# Patient Record
Sex: Male | Born: 1960 | Race: Black or African American | Hispanic: No | Marital: Married | Smoking: Never smoker
Health system: Southern US, Community
[De-identification: ages and names within clinical notes are randomized; demographics above are authoritative.]

## PROBLEM LIST (undated history)

## (undated) DIAGNOSIS — E7800 Pure hypercholesterolemia, unspecified: Secondary | ICD-10-CM

## (undated) DIAGNOSIS — G43909 Migraine, unspecified, not intractable, without status migrainosus: Secondary | ICD-10-CM

## (undated) DIAGNOSIS — G4730 Sleep apnea, unspecified: Secondary | ICD-10-CM

## (undated) HISTORY — PX: HERNIA REPAIR: SHX51

---

## 2011-12-23 ENCOUNTER — Encounter: Payer: Self-pay | Admitting: Sports Medicine

## 2011-12-23 ENCOUNTER — Ambulatory Visit (INDEPENDENT_AMBULATORY_CARE_PROVIDER_SITE_OTHER): Payer: 59 | Admitting: Sports Medicine

## 2011-12-23 VITALS — BP 114/76 | HR 73 | Ht 69.0 in | Wt 215.0 lb

## 2011-12-23 DIAGNOSIS — M5412 Radiculopathy, cervical region: Secondary | ICD-10-CM

## 2011-12-23 MED ORDER — KETOROLAC TROMETHAMINE 60 MG/2ML IM SOLN
60.0000 mg | Freq: Once | INTRAMUSCULAR | Status: AC
  Administered 2011-12-23: 60 mg via INTRAMUSCULAR

## 2011-12-23 MED ORDER — METHYLPREDNISOLONE ACETATE 80 MG/ML IJ SUSP
80.0000 mg | Freq: Once | INTRAMUSCULAR | Status: AC
  Administered 2011-12-23: 80 mg via INTRAMUSCULAR

## 2011-12-23 NOTE — Progress Notes (Signed)
  Subjective:    Patient ID: Trevor Patrick, male    DOB: 05/27/1960, 51 y.o.   MRN: [id number]  HPI chief complaint: Left arm pain and numbness   Patient is a pleasant right-hand-dominant male that comes in today complaining of one half weeks of left arm pain and numbness. No specific injury he recall but gradual onset of pain that seems to concentrate itself in the left elbow. He does have pain more proximally in his shoulder and neck with associated numbness and tingling down the ulnar aspect of his forearm and into his hand. He's noticed weakness with the left arm as well. His symptoms are intermittent. He's had similar symptoms in the past in the right arm. Workup at [facility] revealed cervical degenerative disc disease. He was tried on a couple of different medications but he ultimately took Shelby which resolved his symptoms. He denies any deep-seated shoulder. Pain does occasionally awaken him from sleep.    Review of Systems     Objective:   Physical Exam Well-developed, well-nourished. No acute distress. Awake alert and oriented x3. Vital signs are reviewed.  He has good cervical range of motion with a positive Spurling's to the left. Slight tenderness to palpation along the left parascapular region as well as along the left paraspinal muscles tear of the cervical spine. His strength is 5/5 in both upper extremities. He has a 1/4 biceps reflex on the left and a 1/4 brachial radialis reflex on the left and a trace triceps reflex. This is in comparison to his right side where he has 2/4 biceps reflex, 2/4 brachial radialis reflex, and a trace triceps reflex. He has no noticeable muscle atrophy. Some decreased sensation to light touch along the ulnar aspect of the left hand. Good radial and ulnar pulses. Left shoulder shows full range of motion with no tenderness to palpation. Rotator cuff strength is 5/5. No signs of impingement. Left elbow shows full range of motion. No effusion. No  soft tissue swelling. He has no bony or soft tissue tenderness to direct palpation.       Assessment & Plan:  1. Left arm pain secondary to cervical radiculopathy  Patient has a history of similar symptoms in the right arm. I've asked him to resume using his Boswella since it was beneficial before. We will also inject him with 80 mg of Depo-Medrol IM and 60 mg of Toradol IM. I discussed the possibility of formal physical therapy and prescription strength anti-inflammatories but we will hold on that for now. I will obtain the records from [facility] and they'll call him next week to check on his progress.

## 2011-12-31 ENCOUNTER — Other Ambulatory Visit: Payer: Self-pay | Admitting: *Deleted

## 2011-12-31 ENCOUNTER — Encounter: Payer: Self-pay | Admitting: *Deleted

## 2011-12-31 MED ORDER — PREDNISONE 10 MG PO KIT: PACK | ORAL | Status: DC

## 2011-12-31 NOTE — Progress Notes (Signed)
Pt called stating he is still having significant pain. Sterapred dosepak rx'd per Dr. Margaretha Sheffield.

## 2012-01-03 ENCOUNTER — Encounter: Payer: Self-pay | Admitting: Sports Medicine

## 2012-01-03 ENCOUNTER — Ambulatory Visit
Admission: RE | Admit: 2012-01-03 | Discharge: 2012-01-03 | Disposition: A | Discharge status: Home / Self Care | Payer: 59 | Source: Ambulatory Visit | Attending: Sports Medicine | Admitting: Sports Medicine

## 2012-01-03 ENCOUNTER — Ambulatory Visit (INDEPENDENT_AMBULATORY_CARE_PROVIDER_SITE_OTHER): Payer: 59 | Admitting: Sports Medicine

## 2012-01-03 VITALS — BP 124/82 | HR 76 | Ht 69.0 in | Wt 215.0 lb

## 2012-01-03 DIAGNOSIS — M25519 Pain in unspecified shoulder: Secondary | ICD-10-CM

## 2012-01-03 DIAGNOSIS — M5412 Radiculopathy, cervical region: Secondary | ICD-10-CM

## 2012-01-03 MED ORDER — HYDROCODONE-ACETAMINOPHEN 7.5-300 MG PO TABS: 1.0000 | ORAL_TABLET | Freq: Every evening | ORAL | Status: DC | PRN

## 2012-01-03 NOTE — Patient Instructions (Addendum)
You have been scheduled for a MRI 01/05/12 at 7pm at [address] [facility]   Please go today to have your x-rays done.  You do not need an appointment to get xrays done   Please follow up with Dr. Margaretha Sheffield in 1 week  Thank you for seeing [place] today!

## 2012-01-03 NOTE — Progress Notes (Signed)
  Subjective:    Patient ID: Trevor Patrick, male    DOB: 1961-02-24, 51 y.o.   MRN: [id number]  HPI [name] comes in today with persistent left arm pain. He tried some over-the-counter boswellia and when that didn't help he called late last week. I took him out of work on Friday and call him in a 6 day prednisone Dosepak. He has not tolerated the prednisone well. He instead has tried 800 mg of over-the-counter ibuprofen and this does help him somewhat. In addition to his pain he developed significant weakness in the left arm. He states that he is dropping things. I received his records from [facility]. He had a very similar problem back in February of 2012. X-rays and MRI scan of his cervical spine showed multilevel cervical degenerative disc disease but no discrete disc herniation. His pain at that time was in the right arm and his symptoms eventually resolved.    Review of Systems     Objective:   Physical Exam Mild distress secondary to his pain. Positive Spurling's to the left. Reflexes are 1/4 at the biceps, triceps, and brachial radialis tendons. He has weakness with resisted wrist flexion on the left. Decreased grip strength on the left in comparison to the right. No atrophy. Good radial and ulnar pulses.       Assessment & Plan:  1. Persistent neck pain with left arm radiculopathy-rule out cervical HNP  Given the patient's weakness I think we should proceed with x-rays and an MRI scan of his cervical spine. This is to rule out a cervical disc herniation. In the meantime I will keep him out of work until followup with me in one week. He'll continue with 800 mg of ibuprofen 3 times a day when necessary with food. Prescription for Vicodin ES to take each bedtime when necessary severe pain. We will delineate further treatment based on MRI findings at follow up.

## 2012-01-05 ENCOUNTER — Ambulatory Visit
Admission: RE | Admit: 2012-01-05 | Discharge: 2012-01-05 | Disposition: A | Discharge status: Home / Self Care | Payer: 59 | Source: Ambulatory Visit | Attending: Sports Medicine | Admitting: Sports Medicine

## 2012-01-05 ENCOUNTER — Other Ambulatory Visit: Payer: Self-pay | Admitting: Sports Medicine

## 2012-01-05 DIAGNOSIS — M5412 Radiculopathy, cervical region: Secondary | ICD-10-CM

## 2012-01-10 ENCOUNTER — Ambulatory Visit (INDEPENDENT_AMBULATORY_CARE_PROVIDER_SITE_OTHER): Payer: 59 | Admitting: Sports Medicine

## 2012-01-10 VITALS — BP 126/80 | Ht 69.0 in | Wt 215.0 lb

## 2012-01-10 DIAGNOSIS — M5412 Radiculopathy, cervical region: Secondary | ICD-10-CM

## 2012-01-10 NOTE — Patient Instructions (Addendum)
Please contact the billing department at [facility] -  [phone]  Dr. Alveda Reasons is the specialist Dr. Margaretha Sheffield wants you to schedule an appointment with

## 2012-01-10 NOTE — Progress Notes (Signed)
Patient ID: Trevor Patrick, male   DOB: April 19, 1960, 51 y.o.   MRN: [id number]  Patient comes in today to go over her MRI findings of his cervical spine. He has multilevel spondylosis with moderate foraminal narrowing on the left at C5-C6 and C6-C7. He is switched from ibuprofen back to Mobic in today he is feeling pretty good. Vicodin ES makes him drowsy. This past weekend, however, he was pretty uncomfortable. He feels like the strength is returning and his left arm. He remains out of work. Physical exam was not repeated today. We simply talked about his ongoing left arm cervical radiculopathy. I decided to refer him to Dr. Alveda Reasons at [facility] for further evaluation and treatment. He may benefit from cervical ESIs but he has had side effects in the past with oral prednisone. I will keep him out of work until his evaluation with Dr. Alveda Reasons (out of work note for the rest of this week). I would defer further workup and treatment to the discretion of Dr. Alveda Reasons and the patient will followup with me when necessary.

## 2012-02-23 ENCOUNTER — Ambulatory Visit: Payer: 59

## 2012-12-22 ENCOUNTER — Encounter: Payer: Self-pay | Admitting: Family Medicine

## 2012-12-22 ENCOUNTER — Ambulatory Visit (INDEPENDENT_AMBULATORY_CARE_PROVIDER_SITE_OTHER): Payer: 59 | Admitting: Family Medicine

## 2012-12-22 VITALS — BP 130/90 | Ht 69.0 in | Wt 195.0 lb

## 2012-12-22 DIAGNOSIS — M5010 Cervical disc disorder with radiculopathy, unspecified cervical region: Secondary | ICD-10-CM | POA: Insufficient documentation

## 2012-12-22 DIAGNOSIS — M5412 Radiculopathy, cervical region: Secondary | ICD-10-CM

## 2012-12-22 MED ORDER — MELOXICAM 15 MG PO TABS: 15.0000 mg | ORAL_TABLET | Freq: Every day | ORAL | Status: DC

## 2012-12-22 NOTE — Progress Notes (Signed)
Patient ID: Trevor Patrick, male   DOB: 27-Jan-1961, 52 y.o.   MRN: [id number] 52 year old male with a history of congenital cervical spinal canal narrowing who presents with a complaint of right cervical radiculopathy. Pain radiating down into his right hand mild degree of numbness and tingling in his hand as well. Works with his hands quite a bit and this is affecting his work. Mild relief with ibuprofen. Onset approximately a week ago.    Past history significant for a similar exacerbations approximately once a year. Typically relieved with rest oral anti-inflammatories. Was referred to a spine surgeon however symptoms resolved so no ESI was done. Has taken prednisone in the past however, this did not help his symptoms and he did not tolerate the side effects.  Review of systems as per history of present illness otherwise negative  Examination: Well-developed well-nourished 52 year old African American male awake alert oriented no acute distress  Cervical spine exam:  Tenderness to palpation paraspinous musculature in the lower right.  No midline C-spine tenderness noted.  Neurologic examination:  Strength 5/5 in the right shoulder elbow wrist and hand with full range of motion and equal to the left.  No gross sensory deficits.  Pulses equal in his bilateral upper extremity.   Assessment/plan:  His MRI results from a prior test were reviewed and show fairly significant congenital cervical spine narrowing of the canal.  It is likely that this is the underlying cause of his symptoms today. Will be started on Mobic once daily as this is alleviated his symptoms in the past. In addition, one week off from work.

## 2012-12-22 NOTE — Assessment & Plan Note (Signed)
Be started on Mobic and given one-week off from work today. Followup as needed

## 2012-12-27 ENCOUNTER — Encounter: Payer: Self-pay | Admitting: Sports Medicine

## 2012-12-27 ENCOUNTER — Ambulatory Visit (INDEPENDENT_AMBULATORY_CARE_PROVIDER_SITE_OTHER): Payer: 59 | Admitting: Sports Medicine

## 2012-12-27 VITALS — BP 128/82 | Ht 69.0 in | Wt 195.0 lb

## 2012-12-27 DIAGNOSIS — M5412 Radiculopathy, cervical region: Secondary | ICD-10-CM

## 2012-12-27 NOTE — Progress Notes (Signed)
  Subjective:    Patient ID: Trevor Patrick, male    DOB: 01-08-1961, 52 y.o.   MRN: [id number]  HPI Patient comes in today for followup on cervical radiculopathy. Last seen in the office on October 17. He was started on Mobic at that time which has been helpful. He is currently out of work through the end of this week. Still experiencing intermittent pain in the right side of his neck and down his right arm. Numbness and tingling have improved. No weakness.   Review of Systems     Objective:   Physical Exam Cervical spine: Good cervical mobility with a positive Spurling's to the right. No spasm of the paraspinal musculature. Neurological exam shows 5/5 strength in both upper extremities with reflexes equal at the biceps, triceps, and brachial radialis tendons bilaterally. Sensation is intact to light touch grossly. No atrophy. Good radial and ulnar pulses.       Assessment & Plan:  Right arm pain secondary to cervical radiculopathy.  Patient will continue on his Mobic for one additional week. I think he should remain out of work until November 3rd. We discussed the possibility of cervical ESI's if his symptoms persist. Of note, he has been on oral prednisone in the past and has also been given an IM injection of Depo-Medrol. Neither one were beneficial.

## 2013-08-02 ENCOUNTER — Encounter: Payer: Self-pay | Admitting: Sports Medicine

## 2013-08-02 ENCOUNTER — Ambulatory Visit (INDEPENDENT_AMBULATORY_CARE_PROVIDER_SITE_OTHER): Payer: PRIVATE HEALTH INSURANCE | Admitting: Sports Medicine

## 2013-08-02 VITALS — BP 110/74 | Ht 69.0 in | Wt 196.0 lb

## 2013-08-02 DIAGNOSIS — M461 Sacroiliitis, not elsewhere classified: Secondary | ICD-10-CM

## 2013-08-02 MED ORDER — METHYLPREDNISOLONE ACETATE 80 MG/ML IJ SUSP
80.0000 mg | Freq: Once | INTRAMUSCULAR | Status: AC
  Administered 2013-08-02: 80 mg via INTRAMUSCULAR

## 2013-08-02 MED ORDER — KETOROLAC TROMETHAMINE 60 MG/2ML IM SOLN
60.0000 mg | Freq: Once | INTRAMUSCULAR | Status: AC
  Administered 2013-08-02: 60 mg via INTRAMUSCULAR

## 2013-08-02 MED ORDER — MELOXICAM 15 MG PO TABS
15.0000 mg | ORAL_TABLET | Freq: Every day | ORAL | Status: DC
Start: 2013-08-02 — End: 2017-04-14

## 2013-08-02 NOTE — Progress Notes (Signed)
   Subjective:    Patient ID: Trevor Patrick, male    DOB: 1960/09/15, 53 y.o.   MRN: [id number]  HPI chief complaint: Left hip pain  Patient comes in today complaining of one week of posterior left hip pain. He localizes pain to the left SI joint. No trauma. He describes a sudden onset of pain that began earlier this week. It is sharp in quality and worse with standing and walking. Improves with sitting. Some radiating pain down the back of his leg to his knee but no radiating pain past the knee. No associated numbness or tingling. No deep-seated groin pain. No similar problems in the past. He's been taking some over-the-counter ibuprofen without much symptom relief. He has not been able to work this week due to his pain. No change in bowel or bladder.  Interim medical history reviewed Current medications reviewed No known drug allergies    Review of Systems     Objective:   Physical Exam Well-developed, well-nourished. No acute distress. Awake alert and oriented x3. Sitting comfortable in exam room.  There is tenderness to palpation directly over the left SI joint with a positive FABER. No pain with forward flexion. There is reproducible pain with extension. No tenderness to palpation or percussion along the lumbar midline. Negative straight leg raise, negative log roll. Strength is 5/5 both lower extremities with reflexes brisk and equal at the Achilles and patellar tendons bilaterally. Sensation is intact to light touch grossly. Patient ambulates without a limp.        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 Patient has done well with meloxicam in the past so we will refill this for him to take as needed. He'll use moist heat as needed as well. I've given him a home exercise program and he is given a note excusing him from work this week. He can start to increase activity as tolerated and if symptoms  persist he'll notify me and I'll start with getting  x-rays of his lumbar spine and SI joint. Otherwise, followup when necessary.

## 2013-08-20 ENCOUNTER — Other Ambulatory Visit: Payer: Self-pay | Admitting: *Deleted

## 2013-08-20 ENCOUNTER — Ambulatory Visit: Payer: PRIVATE HEALTH INSURANCE | Admitting: Sports Medicine

## 2013-08-20 ENCOUNTER — Telehealth: Payer: Self-pay | Admitting: Sports Medicine

## 2013-08-20 MED ORDER — PREDNISONE 10 MG PO KIT: PACK | ORAL | Status: DC

## 2013-08-20 NOTE — Telephone Encounter (Signed)
I spoke with the patient on the phone today. Unfortunately, he is still struggling with severe low back pain and radiculopathy. In my absence from the office last week he saw Dr. Kristeen Missan Caffrey at [facility]. He has ordered an MRI of his lumbar spine to be done on June 22. Patient is out of work. I would like to try a 6 day Sterapred Dosepak. He has had success with this in the past. I've asked the patient to followup with me or Dr Madelon Lipsaffrey after his MRI to discuss those findings and deliniate further treatment.

## 2013-09-03 ENCOUNTER — Telehealth: Payer: Self-pay | Admitting: Sports Medicine

## 2013-09-03 NOTE — Telephone Encounter (Signed)
Message copied by Ralene CorkRAPER, TIMOTHY R on Mon Sep 03, 2013  5:09 PM ------      Message from: CERESI, MELANIE L      Created: Thu Aug 30, 2013  9:50 AM      Regarding: mri results      Contact: [phone]       Pt called asking if you would call him when you get back on Monday with Lower back mri results. ------

## 2013-09-03 NOTE — Telephone Encounter (Signed)
I spoke with the patient on the phone after reviewing the MRI report of his lumbar spine from [facility]. Most significant finding is a left L3-L4 foraminal/lateral disc protrusion and mass effect upon the exiting left L3 nerve root. I think this is his pain generator. He tells me that the prednisone dose pack helped him tremendously but that he is still having some residual discomfort. He was actually scheduled to undergo a lumbar ESI with Dr.Ibazebo tomorrow but he canceled that appointment waiting to hear from me. My recommendation is for him to proceed with the lumbar ESI and couple this with some formal physical therapy. He is asking about a repeat Dosepak but I think the injection would benefit him more. I also discussed the possibility of a referral to Dr.Dumonski although I do think that he should exhaust conservative treatment before considering anything surgical. He would like to discuss all this with his wife and he will call me tomorrow. I recommended that he set up a followup appointment sometime in the next week or so so that we can discuss this in further detail face-to-face.

## 2013-09-04 ENCOUNTER — Encounter: Payer: Self-pay | Admitting: Sports Medicine

## 2013-09-07 ENCOUNTER — Telehealth: Payer: Self-pay | Admitting: Sports Medicine

## 2013-09-07 NOTE — Telephone Encounter (Signed)
I spoke with the patient once again on the phone today. He tells me that he is feeling pretty good. His radiculopathy has improved dramatically. However, he would still like to go ahead and proceed with his lumbar epidural steroid injection and some physical therapy. He has rescheduled his appointment with Dr.Ibazebo for later in July. I believe this appointment is for an evaluation and not for an injection. I think this is a good approach. I've encouraged the patient to keep his appointment with Dr. Maurice SmallIbazebo even if his symptoms continue to improve. He is asking about changing his work status on his FMLA paperwork. He is asking about the possibility of changing to intermittent work absences 2-3 times approximately every 4-6 months. I think this is reasonable. He will get me the recommended paperwork for me to make this change. Followup with me when necessary.

## 2014-02-12 ENCOUNTER — Emergency Department (HOSPITAL_COMMUNITY)
Admission: EM | Admit: 2014-02-12 | Discharge: 2014-02-12 | Disposition: A | Discharge status: Home / Self Care | Payer: PRIVATE HEALTH INSURANCE | Source: Home / Self Care | Attending: Emergency Medicine | Admitting: Emergency Medicine

## 2014-02-12 ENCOUNTER — Encounter (HOSPITAL_COMMUNITY): Payer: Self-pay | Admitting: Emergency Medicine

## 2014-02-12 DIAGNOSIS — Z113 Encounter for screening for infections with a predominantly sexual mode of transmission: Secondary | ICD-10-CM

## 2014-02-12 NOTE — ED Notes (Signed)
Requesting check for herpes.  States had an out break years ago.   C/o no symptoms at this time.

## 2014-02-12 NOTE — ED Provider Notes (Signed)
CSN: [id number]     Arrival date & time 02/12/14  1822 History   First MD Initiated Contact with Patient 02/12/14 1922     Chief Complaint  Patient presents with  . Exposure to STD   (Consider location/radiation/quality/duration/timing/severity/associated sxs/prior Treatment) HPI Comments: Wants to be screened for herpes. No sx's. Possible exposure in the past.   History reviewed. No pertinent past medical history. History reviewed. No pertinent past surgical history. History reviewed. No pertinent family history. History  Substance Use Topics  . Smoking status: Never Smoker   . Smokeless tobacco: Never Used  . Alcohol Use: No    Review of Systems  All other systems reviewed and are negative.   Allergies  Review of patient's allergies indicates no known allergies.  Home Medications   Prior to Admission medications   Medication Sig Start Date End Date Taking? Authorizing Provider  Hydrocodone-Acetaminophen 7.5-300 MG TABS Take 1 tablet by mouth at bedtime as needed. 01/03/12   Carlos Levering Draper, DO  meloxicam (MOBIC) 15 MG tablet Take 1 tablet (15 mg total) by mouth daily. 12/22/12   Hennie Duos, MD  meloxicam (MOBIC) 15 MG tablet Take 1 tablet (15 mg total) by mouth daily. 08/02/13   Carlos Levering Draper, DO  PredniSONE 10 MG KIT Sterapred 6 day dose pak.  Take as directed. 08/20/13   Timothy R Draper, DO   BP 125/86 mmHg  Pulse 77  Temp(Src) 98.3 F (36.8 C) (Oral)  Resp 16  SpO2 99% Physical Exam  Constitutional: He is oriented to person, place, and time. He appears well-developed and well-nourished. No distress.  Pulmonary/Chest: Effort normal and breath sounds normal.  Musculoskeletal: Normal range of motion.  Neurological: He is alert and oriented to person, place, and time.  Skin: Skin is warm and dry.  Nursing note and vitals reviewed.   ED Course  Procedures (including critical care time) Labs Review Labs Reviewed  HSV 2 ANTIBODY, IGG    Imaging  Review No results found.   MDM   1. Screening for STD (sexually transmitted disease)    Pt insisting on screening No sx's to tx. Advised if he had this in the past he will always test positive.    Janne Napoleon, NP 02/12/14 1943

## 2014-02-12 NOTE — Discharge Instructions (Signed)

## 2014-02-13 LAB — HSV 2 ANTIBODY, IGG: HSV 2 GLYCOPROTEIN G AB, IGG: 2.67 IV — AB

## 2014-02-14 ENCOUNTER — Telehealth (HOSPITAL_COMMUNITY): Payer: Self-pay | Admitting: *Deleted

## 2014-02-14 NOTE — ED Notes (Signed)
HSV 2 2.67 Pos. I called pt. Pt. verified x 2 and given result.  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 Pt. told to notify his current partner. I asked if he had ever had an outbreak and he said years ago.  Pt. instructed to get treated for each outbreak with Acyclovir or Valtrex and to get a PCP who can call in a prescription when he has an outbreak.  Pt. Voiced understanding. Michaelle BirksvYork, Fortunato Nordin M 02/14/2014

## 2014-07-29 ENCOUNTER — Ambulatory Visit: Payer: PRIVATE HEALTH INSURANCE | Admitting: Sports Medicine

## 2017-04-14 ENCOUNTER — Ambulatory Visit: Payer: PRIVATE HEALTH INSURANCE | Admitting: Sports Medicine

## 2017-04-14 VITALS — BP 122/80 | Ht 69.0 in | Wt 213.0 lb

## 2017-04-14 DIAGNOSIS — M545 Low back pain: Secondary | ICD-10-CM

## 2017-04-15 ENCOUNTER — Encounter: Payer: Self-pay | Admitting: Sports Medicine

## 2017-04-15 NOTE — Progress Notes (Signed)
   Subjective:    Patient ID: Trevor Patrick, male    DOB: [birth year], 57 y.o.   MRN: [id number]  HPI complaint: Low back pain and left leg pain  Patient comes in today complaining of 2-1/2 weeks of left-sided low back pain and left leg pain. He has a well-documented history of lumbar degenerative disc disease and arthritis. Last set of x-rays and MRI were done in 2015. He underwent epidural steroid injections with good symptom relief. He had been pain-free up until about 2-1/2 weeks ago. He began to experience some returning pain that he localizes to the left side of his low back with radiating pain down the left leg diffusely to the left knee. He also experienced some numbness with this episode. No significant weakness. No groin pain. He took a single dose of an 800 mg ibuprofen which has helped him. Today he is feeling better. He is simply here for my opinion as to whether or not we need to pursue any further workup or treatment. He denies recent trauma. No pain at rest. No change in bowel or bladder.  Interim medical history reviewed Medications reviewed Allergies reviewed    Review of Systems    as above Objective:   Physical Exam  Well-developed, well-nourished. No acute distress. Awake alert and oriented 3. Vital signs reviewed.  Lumbar spine: Full lumbar range of motion. No tenderness to palpation along the lumbar midline. No spasm. No tenderness to palpation over the SI joint.  Neurological exam: Negative straight leg raise. Strength is 5/5 bilaterally in both lower extremities. Sensation is intact to light touch grossly. Reflexes brisk and equal at the Achilles and patellar tendons. Good pulses.    Assessment & Plan:   Returning low back pain secondary to lumbar degenerative disc disease  Since the patient is improving I'm going to hold off on any further workup or treatment at this time. He still has his 800 mg ibuprofen to take as needed. He also had some improvement  previously with Boswellia so he may use this as needed as well. He'll be out of work today and tomorrow and I'm hoping that his symptoms continue to improve to the point of resolution early next week. If not, we will need to start with updated imaging of his lumbar spine. Of note, I treated him in the past with both meloxicam and prednisone and neither one was of much benefit. Follow-up for ongoing or recalcitrant issues.

## 2019-05-09 ENCOUNTER — Telehealth (HOSPITAL_COMMUNITY): Payer: Self-pay | Admitting: Licensed Clinical Social Worker

## 2019-05-29 NOTE — Telephone Encounter (Signed)
error 

## 2020-03-02 ENCOUNTER — Emergency Department (HOSPITAL_BASED_OUTPATIENT_CLINIC_OR_DEPARTMENT_OTHER)
Admission: EM | Admit: 2020-03-02 | Discharge: 2020-03-02 | Disposition: A | Discharge status: Home / Self Care | Payer: POS | Attending: Emergency Medicine | Admitting: Emergency Medicine

## 2020-03-02 ENCOUNTER — Other Ambulatory Visit: Payer: Self-pay

## 2020-03-02 ENCOUNTER — Emergency Department (HOSPITAL_BASED_OUTPATIENT_CLINIC_OR_DEPARTMENT_OTHER): Payer: POS

## 2020-03-02 ENCOUNTER — Encounter (HOSPITAL_BASED_OUTPATIENT_CLINIC_OR_DEPARTMENT_OTHER): Payer: Self-pay | Admitting: *Deleted

## 2020-03-02 DIAGNOSIS — U071 COVID-19: Secondary | ICD-10-CM | POA: Insufficient documentation

## 2020-03-02 DIAGNOSIS — E669 Obesity, unspecified: Secondary | ICD-10-CM | POA: Diagnosis not present

## 2020-03-02 DIAGNOSIS — R519 Headache, unspecified: Secondary | ICD-10-CM | POA: Diagnosis present

## 2020-03-02 HISTORY — DX: Migraine, unspecified, not intractable, without status migrainosus: G43.909

## 2020-03-02 LAB — RESP PANEL BY RT-PCR (FLU A&B, COVID) ARPGX2
Influenza A by PCR: NEGATIVE
Influenza B by PCR: NEGATIVE
SARS Coronavirus 2 by RT PCR: POSITIVE — AB

## 2020-03-02 NOTE — ED Notes (Signed)
EDP at bedside  

## 2020-03-02 NOTE — ED Notes (Signed)
Dr. Horton aware of positive covid result.  

## 2020-03-02 NOTE — ED Provider Notes (Signed)
[facility] EMERGENCY DEPARTMENT Provider Note   CSN: [id number] Arrival date & time: 03/02/20  0254     History Chief Complaint  Patient presents with  . Other    Covid exposure     Trevor Patrick is a 59 y.o. male.  HPI     This 59 year old male who presents with upper respiratory symptoms.  Patient reports that his girlfriend was diagnosed with COVID-19 on Friday.  He has been having runny nose, congestion, headache since Wednesday of last week.  He has been fully vaccinated against COVID-19.  He reports that he is short of breath "some of the time."  Denies shortness of breath at this time.  No chest pain.  No nausea, vomiting, diarrhea.  He reports chills at home.  No documented fevers.  He has been taking Coricidin HBP with minimal relief.  No Tylenol or ibuprofen.  Past Medical History:  Diagnosis Date  . Migraine     Patient Active Problem List   Diagnosis Date Noted  . Cervical disc disorder with radiculopathy of cervical region 12/22/2012    Past Surgical History:  Procedure Laterality Date  . HERNIA REPAIR         No family history on file.  Social History   Tobacco Use  . Smoking status: Never Smoker  . Smokeless tobacco: Never Used  Substance Use Topics  . Alcohol use: No  . Drug use: No    Home Medications Prior to Admission medications   Not on File    Allergies    Patient has no known allergies.  Review of Systems   Review of Systems  Constitutional: Positive for chills. Negative for fever.  HENT: Positive for congestion.   Respiratory: Positive for cough and shortness of breath.   Cardiovascular: Negative for chest pain.  Gastrointestinal: Negative for abdominal pain, diarrhea, nausea and vomiting.  Neurological: Positive for headaches.  All other systems reviewed and are negative.   Physical Exam Updated Vital Signs BP (!) 127/98   Pulse 91   Temp 98.2 F (36.8 C)   Resp 20   Ht 1.753 m (5\' 9" )   Wt 99.8 kg    SpO2 96%   BMI 32.49 kg/m   Physical Exam Vitals and nursing note reviewed.  Constitutional:      Appearance: He is well-developed and well-nourished. He is obese. He is not ill-appearing.  HENT:     Head: Normocephalic and atraumatic.     Nose: Congestion present.     Mouth/Throat:     Mouth: Mucous membranes are moist.  Eyes:     Pupils: Pupils are equal, round, and reactive to light.  Cardiovascular:     Rate and Rhythm: Normal rate and regular rhythm.     Heart sounds: Normal heart sounds. No murmur heard.   Pulmonary:     Effort: Pulmonary effort is normal. No respiratory distress.     Breath sounds: Normal breath sounds. No wheezing.  Abdominal:     General: Bowel sounds are normal.     Palpations: Abdomen is soft.     Tenderness: There is no abdominal tenderness. There is no rebound.  Musculoskeletal:        General: No edema.     Cervical back: Neck supple.     Right lower leg: No edema.     Left lower leg: No edema.  Lymphadenopathy:     Cervical: No cervical adenopathy.  Skin:    General: Skin is warm and dry.  Neurological:     Mental Status: He is alert and oriented to person, place, and time.  Psychiatric:        Mood and Affect: Mood and affect and mood normal.     ED Results / Procedures / Treatments   Labs (all labs ordered are listed, but only abnormal results are displayed) Labs Reviewed  RESP PANEL BY RT-PCR (FLU A&B, COVID) ARPGX2 - Abnormal; Notable for the following components:      Result Value   SARS Coronavirus 2 by RT PCR POSITIVE (*)    All other components within normal limits    EKG None  Radiology DG Chest Portable 1 View  Result Date: 03/02/2020 CLINICAL DATA:  Right knee nose with congestion and headache for 1 week. EXAM: PORTABLE CHEST 1 VIEW COMPARISON:  None. FINDINGS: 0550 hours. Probable retrocardiac atelectasis. Right lung clear. The cardiopericardial silhouette is within normal limits for size. The visualized bony  structures of the thorax show no acute abnormality. IMPRESSION: Probable retrocardiac atelectasis. Electronically Signed   By: Kennith Center M.D.   On: 03/02/2020 06:03    Procedures Procedures (including critical care time)  Medications Ordered in ED Medications - No data to display  ED Course  I have reviewed the triage vital signs and the nursing notes.  Pertinent labs & imaging results that were available during my care of the patient were reviewed by me and considered in my medical decision making (see chart for details).    MDM Rules/Calculators/A&P                          Patient presents with upper respiratory symptoms.  He is concerned he may have COVID-19.  Girlfriend tested +2 days ago.  He is overall nontoxic and vital signs are reassuring.  He is afebrile.  O2 sats 96%.  No respiratory distress.  Pulmonary exam is fairly clear.  Will obtain chest x-ray just given reports of shortness of breath.  No chest pain to suggest ACS.  Chest x-ray shows no evidence of pneumothorax or pneumonia.  Covid testing sent and is positive for COVID-19.  Patient is obese.  Would likely qualify for monoclonal antibody therapy.  Will refer him through private message.  Recommend supportive measures at home including hydration and Tylenol or Motrin.  After history, exam, and medical workup I feel the patient has been appropriately medically screened and is safe for discharge home. Pertinent diagnoses were discussed with the patient. Patient was given return precautions.   Final Clinical Impression(s) / ED Diagnoses Final diagnoses:  COVID-19    Rx / DC Orders ED Discharge Orders    None       Jahara Dail, Mayer Masker, MD 03/02/20 [phone]

## 2020-03-02 NOTE — ED Triage Notes (Addendum)
Pt c/o covid exposure. Girlfriend was diagnosed on Friday with covid. C/o runny nose, congestion, and headache for one week. Has been vaccinated for covid. States he feels sob at times. Denies at present. States he has had fevers but has not taken any recent tylenol or ibuprofen.

## 2020-03-02 NOTE — Discharge Instructions (Addendum)
You were seen today and test confirm you are positive for COVID-19.  Take Tylenol or Motrin at home for body aches, pains, chills, headache.  Make sure that you are staying hydrated.  I have referred you to the monoclonal antibody clinic.  You should receive a call.  If you have any new or worsening symptoms you should be reevaluated.

## 2020-07-08 ENCOUNTER — Ambulatory Visit: Payer: PRIVATE HEALTH INSURANCE | Admitting: Sports Medicine

## 2021-08-02 ENCOUNTER — Encounter: Payer: Self-pay | Admitting: Emergency Medicine

## 2021-08-02 ENCOUNTER — Ambulatory Visit (INDEPENDENT_AMBULATORY_CARE_PROVIDER_SITE_OTHER): Payer: PRIVATE HEALTH INSURANCE

## 2021-08-02 ENCOUNTER — Ambulatory Visit
Admission: EM | Admit: 2021-08-02 | Discharge: 2021-08-02 | Disposition: A | Discharge status: Home / Self Care | Payer: PRIVATE HEALTH INSURANCE | Attending: Urgent Care | Admitting: Urgent Care

## 2021-08-02 DIAGNOSIS — M542 Cervicalgia: Secondary | ICD-10-CM | POA: Diagnosis not present

## 2021-08-02 DIAGNOSIS — M5030 Other cervical disc degeneration, unspecified cervical region: Secondary | ICD-10-CM

## 2021-08-02 DIAGNOSIS — M4802 Spinal stenosis, cervical region: Secondary | ICD-10-CM

## 2021-08-02 DIAGNOSIS — R202 Paresthesia of skin: Secondary | ICD-10-CM | POA: Diagnosis not present

## 2021-08-02 DIAGNOSIS — M5412 Radiculopathy, cervical region: Secondary | ICD-10-CM

## 2021-08-02 DIAGNOSIS — R200 Anesthesia of skin: Secondary | ICD-10-CM

## 2021-08-02 MED ORDER — PREDNISONE 20 MG PO TABS: ORAL_TABLET | ORAL | 0 refills | Status: AC

## 2021-08-02 MED ORDER — TIZANIDINE HCL 4 MG PO TABS: 4.0000 mg | ORAL_TABLET | Freq: Every day | ORAL | 0 refills | Status: AC

## 2021-08-02 MED ORDER — PREDNISONE 20 MG PO TABS: ORAL_TABLET | ORAL | 0 refills | Status: DC

## 2021-08-02 NOTE — ED Provider Notes (Signed)
[facility] - URGENT CARE CENTER   MRN: [id number] DOB: August 08, 1960  Subjective:   Kerman Pfost is a 61 y.o. male presenting for 1 week history of persistent right-sided neck pain, tingling along the side of his neck, radiates into the trapezius on the right side.  Has had intermittent numbness of the hand as well.  Had concerns about stroke because his brother suffer from this around his same age.  No history of heart disease, MI.  Patient does have a history of headaches and has had them before but they are unchanged.  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  No overt chest pain, diaphoresis, left-sided neck pain, left arm pain, shortness of breath, nausea, vomiting, abdominal pain.  Patient is not a smoker.  No alcohol use.  No drug use.  No current facility-administered medications for this encounter.  Current Outpatient Medications:    buPROPion (WELLBUTRIN XL) 300 MG 24 hr tablet, TAKE ONE TABLET BY MOUTH EVERY MORNING FOR MOOD          TAKE AT BREAKFAST, Disp: , Rfl:    topiramate (TOPAMAX) 100 MG tablet, TAKE ONE TABLET BY MOUTH AT BEDTIME FOR 7 DAYS, THEN TAKE ONE TABLET TWICE A DAY FOR MIGRAINE, Disp: , Rfl:    rosuvastatin (CRESTOR) 40 MG tablet, Take by mouth., Disp: , Rfl:    No Known Allergies  Past Medical History:  Diagnosis Date   Migraine      Past Surgical History:  Procedure Laterality Date   HERNIA REPAIR      History reviewed. No pertinent family history.  Social History   Tobacco Use   Smoking status: Never   Smokeless tobacco: Never  Substance Use Topics   Alcohol use: No   Drug use: No    ROS   Objective:   Vitals: BP 128/85   Pulse 69   Temp 98.5 F (36.9 C)   Resp 20   SpO2 98%   Physical Exam Constitutional:      General: He is not in acute distress.    Appearance: Normal appearance. He is well-developed and normal weight. He is not ill-appearing, toxic-appearing or diaphoretic.  HENT:     Head: Normocephalic and  atraumatic.     Right Ear: External ear normal.     Left Ear: External ear normal.     Nose: Nose normal.     Mouth/Throat:     Mouth: Mucous membranes are moist.     Pharynx: Oropharynx is clear.  Eyes:     General: No scleral icterus.       Right eye: No discharge.        Left eye: No discharge.     Extraocular Movements: Extraocular movements intact.  Cardiovascular:     Rate and Rhythm: Normal rate and regular rhythm.     Heart sounds: Normal heart sounds. No murmur heard.   No friction rub. No gallop.  Pulmonary:     Effort: Pulmonary effort is normal. No respiratory distress.     Breath sounds: Normal breath sounds. No stridor. No wheezing, rhonchi or rales.  Musculoskeletal:     Cervical back: Normal range of motion.  Neurological:     Mental Status: He is alert and oriented to person, place, and time.     Cranial Nerves: No cranial nerve deficit.     Motor: No weakness.     Coordination: Coordination normal.     Gait: Gait normal.     Deep Tendon  Reflexes: Reflexes normal.  Psychiatric:        Mood and Affect: Mood normal.        Behavior: Behavior normal.        Thought Content: Thought content normal.        Judgment: Judgment normal.    ED ECG REPORT   Date: 08/02/2021  EKG Time: 9:18 AM  Rate: 69bpm  Rhythm: normal sinus rhythm,  there are no previous tracings available for comparison  Axis: normal  Intervals:none  ST&T Change: normal  Narrative Interpretation: Sinus rhythm 69bpm. No acute findings. No previous ecg.   01/05/2012 MR Cervical Spine WO contrast IMPRESSION:   1.  Congenitally small cervical spinal canal.  2.  Superimposed spondylosis from C4-C5 through C7-T1 as described.  There is mild central stenosis from C4-C5 through C6-C7 without  cord deformity.  There is moderate foraminal narrowing on the left  at C5-C6 and C6-C7 which could affect the exiting nerve roots.    Original Report Authenticated By: Carey Bullocks, M.D.   Assessment  and Plan :   PDMP not reviewed this encounter.  1. Degenerative disc disease, cervical   2. Neck pain   3. Numbness and tingling   4. Cervical radiculopathy   5. Foraminal stenosis of cervical region    X-ray over-read was pending at time of discharge, recommended follow up with only abnormal results. Otherwise will not call for negative over-read. Patient was in agreement. Spondylosis was noted on my personal review, disc space narrowing as well. Will use an oral prednisone course with tizanidine. Follow up with [facility]. Counseled patient on potential for adverse effects with medications prescribed/recommended today, ER and return-to-clinic precautions discussed, patient verbalized understanding.    Wallis Bamberg, PA-C 08/02/21 1003

## 2021-08-02 NOTE — ED Triage Notes (Signed)
Pt here with right neck pain and and tingling with some numbness that radiates down to the right arm and hand x 1 week.

## 2021-08-04 ENCOUNTER — Telehealth: Payer: Self-pay | Admitting: Emergency Medicine

## 2021-08-04 NOTE — Telephone Encounter (Signed)
Erroneous entry, encounter not needed.

## 2022-03-25 ENCOUNTER — Ambulatory Visit (INDEPENDENT_AMBULATORY_CARE_PROVIDER_SITE_OTHER): Payer: Commercial Managed Care - PPO | Admitting: Sports Medicine

## 2022-03-25 VITALS — BP 118/88 | Ht 69.0 in | Wt 235.0 lb

## 2022-03-25 DIAGNOSIS — M4802 Spinal stenosis, cervical region: Secondary | ICD-10-CM | POA: Diagnosis not present

## 2022-03-25 MED ORDER — MELOXICAM 15 MG PO TABS: ORAL_TABLET | ORAL | 1 refills | Status: AC

## 2022-03-25 NOTE — Progress Notes (Deleted)
  SUBJECTIVE:   CHIEF COMPLAINT / HPI:   Cervical radiculopathy:   C-spine MRI     PERTINENT  PMH / PSH: ***  Past Medical History:  Diagnosis Date   Migraine     OBJECTIVE:  There were no vitals taken for this visit. Physical Exam   ASSESSMENT/PLAN:  There are no diagnoses linked to this encounter. No follow-ups on file. Erskine Emery, MD 03/25/2022, 8:35 AM PGY-***, [facility] {    This will disappear when note is signed, click to select method of visit    :1}

## 2022-03-25 NOTE — Progress Notes (Signed)
   Subjective:    Patient ID: Trevor Patrick, male    DOB: [birth year], 61 y.o.   MRN: [id number]  HPI chief complaint: Neck pain  Trevor Patrick is a very pleasant 62 year old male that comes in today complaining of neck pain and right hand numbness.  He has been seen in the past before for similar issues.  An MRI done in 2013 showed a congenitally small cervical spinal canal with mild central stenosis from C4-C5 through C6-C7.  Since that time, he has had intermittent pain for years but it became quite severe this past spring.  He was concerned that he may be having a stroke since he was getting worsening pain and numbness in his right arm so he sought treatment at a local urgent care.  He was told during that visit that he was not having a stroke but that his symptoms were coming from his cervical spine.  He presents today to discuss treatment for this.  He has persistent numbness along the fourth and fifth digits in the ulnar aspect of his hand and does endorse some intermittent weakness but nothing too severe.  Today his pain is not too bad.  But he would like to be proactive in preventing future episodes.  No recent trauma.  Past medical history reviewed Medications reviewed Allergies reviewed    Review of Systems As above    Objective:   Physical Exam  Well-developed, well-nourished.  No acute distress.  Cervical spine: Good cervical range of motion.  No tenderness to palpation.  No spasm.  Neurological exam: No atrophy of either upper extremity.  Strength is 5/5 in both upper extremities and reflexes are 1/4 and equal at the biceps, triceps, and brachial radialis tendons bilaterally.  He does have some decrease sensation along the ulnar aspect of the right hand but excellent grip strength and no signs of atrophy.  Good pulses.  MRI of the cervical spine from 2013 is as above      Assessment & Plan:   Cervical spinal stenosis  Trevor Patrick had good success in the past with meloxicam.  I  have given him a new prescription and he will take this as needed.  He would also like to try some physical therapy so we will set that up at [facility] outpatient PT.  He will follow-up with me again in about 6 weeks.  We will hold on updated imaging at this time but will reconsider that at follow-up if he does not notice any improvement with meloxicam and PT.  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  This note was dictated using Dragon naturally speaking software and may contain errors in syntax, spelling, or content which have not been identified prior to signing this note.

## 2022-04-06 ENCOUNTER — Ambulatory Visit: Payer: PRIVATE HEALTH INSURANCE | Admitting: Physical Therapy

## 2022-05-06 ENCOUNTER — Ambulatory Visit: Payer: PRIVATE HEALTH INSURANCE | Admitting: Sports Medicine

## 2022-05-13 ENCOUNTER — Ambulatory Visit: Payer: PRIVATE HEALTH INSURANCE | Admitting: Sports Medicine

## 2022-05-25 ENCOUNTER — Ambulatory Visit (INDEPENDENT_AMBULATORY_CARE_PROVIDER_SITE_OTHER): Payer: 59 | Admitting: Sports Medicine

## 2022-05-25 ENCOUNTER — Ambulatory Visit
Admission: RE | Admit: 2022-05-25 | Discharge: 2022-05-25 | Disposition: A | Discharge status: Home / Self Care | Payer: 59 | Source: Ambulatory Visit | Attending: Sports Medicine | Admitting: Sports Medicine

## 2022-05-25 VITALS — BP 138/86 | Ht 69.0 in | Wt 235.0 lb

## 2022-05-25 DIAGNOSIS — M2141 Flat foot [pes planus] (acquired), right foot: Secondary | ICD-10-CM | POA: Diagnosis not present

## 2022-05-25 DIAGNOSIS — G8929 Other chronic pain: Secondary | ICD-10-CM | POA: Diagnosis not present

## 2022-05-25 DIAGNOSIS — M545 Low back pain, unspecified: Secondary | ICD-10-CM

## 2022-05-25 DIAGNOSIS — M2142 Flat foot [pes planus] (acquired), left foot: Secondary | ICD-10-CM | POA: Diagnosis not present

## 2022-05-26 NOTE — Progress Notes (Signed)
   Subjective:    Patient ID: Trevor Patrick, male    DOB: 02/02/1961, 62 y.o.   MRN: [id number]  HPI chief complaint: Left hip pain  Trevor Patrick presents today with returning posterior left hip pain.  He has a documented history of lumbar radiculopathy.  An MRI of his lumbar spine done in 2015 showed a leftward L3-L4 foraminal disc protrusion with mass effect on the left L3 nerve root.  In addition, he also had mild L4-L5 spinal stenosis as well as degenerative facet changes at L5-S1.  Pain is localized to the posterior hip.  He denies any groin pain.  Pain is worse with prolonged standing and walking.  Improves with sitting.  Pain will radiate down to the knee but not past the knee.  He takes ibuprofen as needed which does help.  He has not noticed any weakness.  He is also requesting a referral to podiatry for his pes planus.    Review of Systems As above    Objective:   Physical Exam  Well-developed, well-nourished.  No acute distress  Left hip: Smooth painless hip range of motion with a negative logroll.  Diffuse tenderness to palpation around the posterior hip.  Negative straight leg raise.  Good lumbar range of motion with slight reproducible pain with extension.  No focal strength deficits appreciated.  Neurovascularly intact distally.  Examination of his feet in standing position does show pes planus bilaterally.  Good calcaneal inversion when standing on his tiptoes.      Assessment & Plan:   Returning posterior left hip pain secondary to lumbar degenerative disc disease versus facet arthropathy Pes planus  I am going to start with getting updated x-rays of his lumbar spine.  I will call him when those results are available.  We will likely start with some physical therapy.  I will refer him to Dr. Daylene Katayama for his pes planus.  This note was dictated using Dragon naturally speaking software and may contain errors in syntax, spelling, or content which have not been identified  prior to signing this note.

## 2022-05-31 ENCOUNTER — Other Ambulatory Visit: Payer: Self-pay

## 2022-05-31 ENCOUNTER — Telehealth: Payer: Self-pay | Admitting: Sports Medicine

## 2022-05-31 DIAGNOSIS — M5136 Other intervertebral disc degeneration, lumbar region: Secondary | ICD-10-CM

## 2022-05-31 NOTE — Telephone Encounter (Signed)
I spoke with Trevor Patrick on the phone today after reviewing x-rays of his lumbar spine.  X-rays do demonstrate some degenerative disc disease which was also seen on his previous MRI in 2015.  He also has facet arthrosis at L4-S1.  Incidental finding of some left hip arthritis but clinically I believe his symptoms are arising from his lumbar spine.  I will refer him to physical therapy and he will follow-up with me approximately 4 weeks after starting PT.  If symptoms persist then we may need to consider an updated MRI of his lumbar spine.

## 2022-06-01 ENCOUNTER — Ambulatory Visit: Admitting: Podiatry

## 2022-06-02 ENCOUNTER — Ambulatory Visit: Payer: 59 | Admitting: Podiatry

## 2022-08-25 ENCOUNTER — Ambulatory Visit (INDEPENDENT_AMBULATORY_CARE_PROVIDER_SITE_OTHER): Payer: 59 | Admitting: Podiatry

## 2022-08-25 ENCOUNTER — Ambulatory Visit (INDEPENDENT_AMBULATORY_CARE_PROVIDER_SITE_OTHER): Payer: 59

## 2022-08-25 DIAGNOSIS — M2141 Flat foot [pes planus] (acquired), right foot: Secondary | ICD-10-CM

## 2022-08-25 DIAGNOSIS — M7751 Other enthesopathy of right foot: Secondary | ICD-10-CM | POA: Diagnosis not present

## 2022-08-25 DIAGNOSIS — M2142 Flat foot [pes planus] (acquired), left foot: Secondary | ICD-10-CM | POA: Diagnosis not present

## 2022-08-25 MED ORDER — BETAMETHASONE SOD PHOS & ACET 6 (3-3) MG/ML IJ SUSP
3.0000 mg | Freq: Once | INTRAMUSCULAR | Status: AC
  Administered 2022-08-25: 3 mg via INTRA_ARTICULAR

## 2022-08-25 NOTE — Progress Notes (Signed)
Chief Complaint  Patient presents with   Flat Foot    Pes planus of both feet Referring Provider: Reino Bellis R. Patient has been having pain constantly that is starting to effect his gait and hips. Patient is starting to lose balance. 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 No treatment    Subjective:  62 y.o. male presenting today as a new patient referral from Dr. Reino Bellis, sports medicine, for evaluation of pain and tenderness associated to the right ankle as well as flatfoot deformity.  Patient believes that his flatfoot deformity was exacerbated during his service in the [employer].   Patient states of the past few days he has developed idiopathic onset of pain and tenderness associated to the lateral aspect of the right ankle.  Pain with ambulation.  He has not done anything for treatment presenting for further treatment and evaluation   Past Medical History:  Diagnosis Date   Migraine    Past Surgical History:  Procedure Laterality Date   HERNIA REPAIR     Allergies  Allergen Reactions   Nortriptyline Other (See Comments)    Other Reaction(s): Weight Gain   Sumatriptan Other (See Comments)       Objective/Physical Exam General: The patient is alert and oriented x3 in no acute distress.  Dermatology: Skin is warm, dry and supple bilateral lower extremities. Negative for open lesions or macerations.  Vascular: Palpable pedal pulses bilaterally. No edema or erythema noted. Capillary refill within normal limits.  Neurological: Light touch and protective threshold grossly intact bilaterally.   Musculoskeletal Exam: Range of motion within normal limits to all pedal and ankle joints bilateral. Muscle strength 5/5 in all groups bilateral. Upon weightbearing there is a medial longitudinal arch collapse bilaterally. Remove foot valgus noted to the bilateral lower extremities with excessive  pronation upon mid stance.  There is also tenderness with palpation over the lateral aspect of the right ankle  Radiographic Exam LT ankle and RT foot 08/25/2022:  Normal osseous mineralization. Joint spaces preserved. No fracture/dislocation/boney destruction.  Pes planus noted on radiographic exam lateral views. Decreased calcaneal inclination and metatarsal declination angle is noted. Anterior break in the cyma line noted on lateral views. Medial talar head to deviation noted on AP radiograph.  Degenerative changes noted with dorsal spurring noted to the TN joint best visualized on lateral view.  Assessment: 1. pes planus bilateral 2.  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 X-Rays reviewed.  -Injection of 0.5 cc Celestone Soluspan injected into the lateral aspect of the right ankle -Ankle brace dispensed.  Wear daily with good supportive tennis shoes -The patient would benefit from custom molded functional orthotics to support the medial longitudinal arch of the foot and alleviate the pain associated to his flatfoot deformity. -Patient molded for custom orthotics today.  Order placed -Return to clinic for orthotics pickup   Felecia Shelling, DPM [facility]  Dr. Felecia Shelling, DPM    [address]                                        [place], [place] [address]                Office [phone]  Fax [phone]

## 2022-09-14 ENCOUNTER — Ambulatory Visit: Payer: PRIVATE HEALTH INSURANCE | Admitting: Sports Medicine

## 2022-09-16 ENCOUNTER — Telehealth: Payer: Self-pay | Admitting: Podiatry

## 2022-09-16 NOTE — Telephone Encounter (Signed)
Orthotics in, lvm for pt to call to schedule an appt to pick them up.

## 2022-09-21 ENCOUNTER — Ambulatory Visit: Payer: PRIVATE HEALTH INSURANCE | Admitting: Sports Medicine

## 2022-10-12 ENCOUNTER — Ambulatory Visit (INDEPENDENT_AMBULATORY_CARE_PROVIDER_SITE_OTHER): Payer: 59

## 2022-10-12 DIAGNOSIS — M2141 Flat foot [pes planus] (acquired), right foot: Secondary | ICD-10-CM

## 2022-10-12 DIAGNOSIS — M2142 Flat foot [pes planus] (acquired), left foot: Secondary | ICD-10-CM | POA: Diagnosis not present

## 2022-10-12 DIAGNOSIS — M7751 Other enthesopathy of right foot: Secondary | ICD-10-CM

## 2022-10-12 NOTE — Progress Notes (Signed)
Patient presents today to pick up custom molded foot orthotics, diagnosed with Pes Planus and capsulitis by Dr. Logan Bores.   Orthotics were dispensed and fit was satisfactory. Reviewed instructions for break-in and wear. Written instructions given to patient.  Patient will follow up as needed.   Addison Bailey CPed, CFo, CFm

## 2022-10-19 ENCOUNTER — Ambulatory Visit (INDEPENDENT_AMBULATORY_CARE_PROVIDER_SITE_OTHER): Payer: 59 | Admitting: Sports Medicine

## 2022-10-19 VITALS — BP 122/80 | Ht 69.0 in | Wt 235.0 lb

## 2022-10-19 DIAGNOSIS — M5416 Radiculopathy, lumbar region: Secondary | ICD-10-CM

## 2022-10-19 NOTE — Progress Notes (Signed)
   Subjective:    Patient ID: Trevor Patrick, male    DOB: [birth year], 62 y.o.   MRN: [id number]  HPI chief complaint: Low back pain  Trevor Patrick presents today with returning low back pain.  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  Pain will begin in the right thigh and radiate down into the lateral aspect of the right lower leg.  It resolves with standing.  He has had longstanding problems with his lumbar spine.  X-rays done earlier this year showed some mild degenerative changes and facet arthrosis but an MRI of his lumbar spine done in 2015 showed some foraminal disc protrusions, primarily on the left.  He also had mild spinal stenosis.  He denies any symptoms in the left leg.    Review of Systems     Objective:   Physical Exam  Comfortable in the exam room  Fairly good lumbar mobility.  Good strength in both lower legs.      Assessment & Plan:   Returning low back pain likely secondary to bulging lumbar disc  I think at this point in time it is reasonable to get an updated MRI of his lumbar spine.  I will notify him of those results when available.  In the meantime, he will start some McKenzie extension home exercises.  This note was dictated using Dragon naturally speaking software and may contain errors in syntax, spelling, or content which have not been identified prior to signing this note.

## 2022-11-09 ENCOUNTER — Ambulatory Visit
Admission: RE | Admit: 2022-11-09 | Discharge: 2022-11-09 | Disposition: A | Discharge status: Home / Self Care | Payer: 59 | Source: Ambulatory Visit | Attending: Sports Medicine | Admitting: Sports Medicine

## 2022-11-09 DIAGNOSIS — M5416 Radiculopathy, lumbar region: Secondary | ICD-10-CM

## 2022-11-23 ENCOUNTER — Ambulatory Visit (INDEPENDENT_AMBULATORY_CARE_PROVIDER_SITE_OTHER): Payer: 59 | Admitting: Sports Medicine

## 2022-11-23 VITALS — BP 122/86 | Ht 69.0 in | Wt 235.0 lb

## 2022-11-23 DIAGNOSIS — M5416 Radiculopathy, lumbar region: Secondary | ICD-10-CM

## 2022-11-23 DIAGNOSIS — M47816 Spondylosis without myelopathy or radiculopathy, lumbar region: Secondary | ICD-10-CM

## 2022-11-23 NOTE — Progress Notes (Signed)
Patient ID: Trevor Patrick, male   DOB: May 02, 1960, 62 y.o.   MRN: [id number]  Trevor Patrick presents today to discuss MRI findings of his lumbar spine.  MRI shows multilevel degenerative disc disease with some multilevel bulging disks.  He also has facet arthropathy at L4-L5 and L5-S1.  His main complaint today is left-sided low back pain is worse with standing.  It is burning in quality and radiates down the posterior leg to the knee.  No associated numbness or tingling.  Physical exam was not repeated today.  We simply talked about treatment going forward.  For diagnostic as well as therapeutic reasons I have recommended facet injections at both the L4 and L5 level bilaterally.  This will be done at [facility] imaging and the patient will subsequently follow-up with me 2 weeks later.  This note was dictated using Dragon naturally speaking software and may contain errors in syntax, spelling, or content which have not been identified prior to signing this note.

## 2022-11-24 ENCOUNTER — Other Ambulatory Visit: Payer: Self-pay | Admitting: Sports Medicine

## 2022-11-24 DIAGNOSIS — M5416 Radiculopathy, lumbar region: Secondary | ICD-10-CM

## 2023-03-08 IMAGING — DX DG CERVICAL SPINE COMPLETE 4+V
5 series · 5 of 5 positions shown · non-contrast
Comparison: None Available.

CLINICAL DATA: Neck pain with tingling and some numbness radiating
down the right arm and into the hand for 1 week.

EXAM:
CERVICAL SPINE - COMPLETE 4+ VIEW

[cervical spine lat]
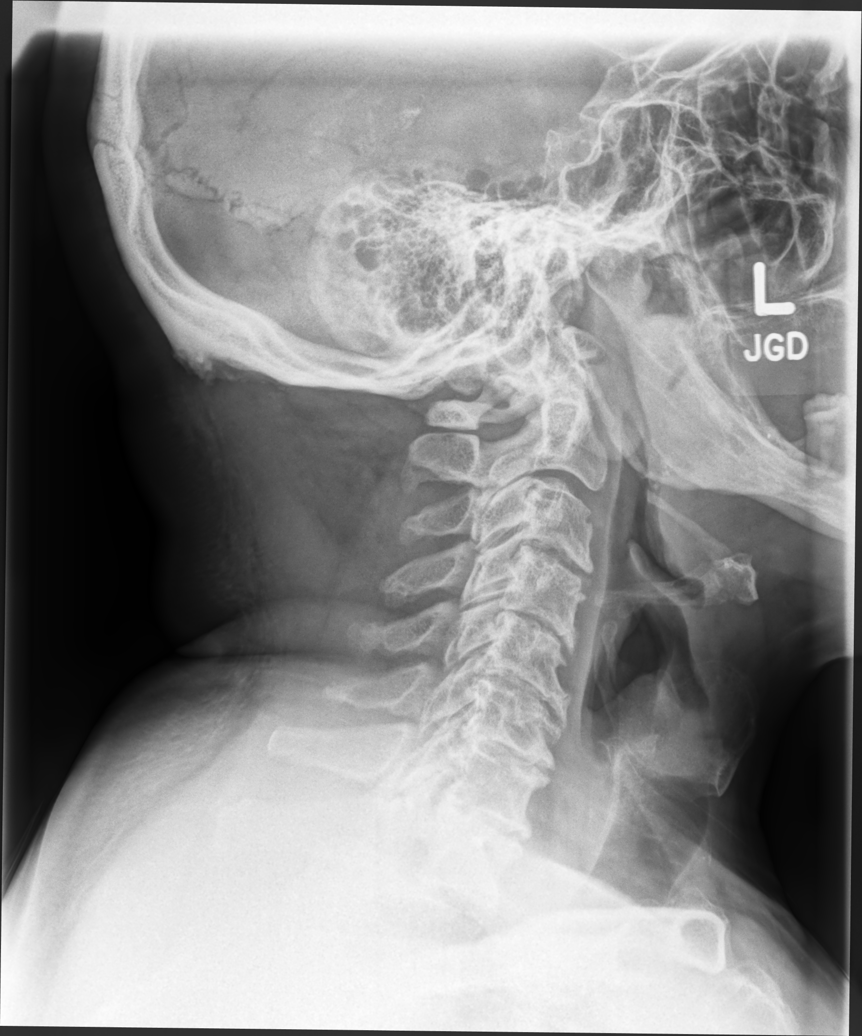

[cervical spine ap]
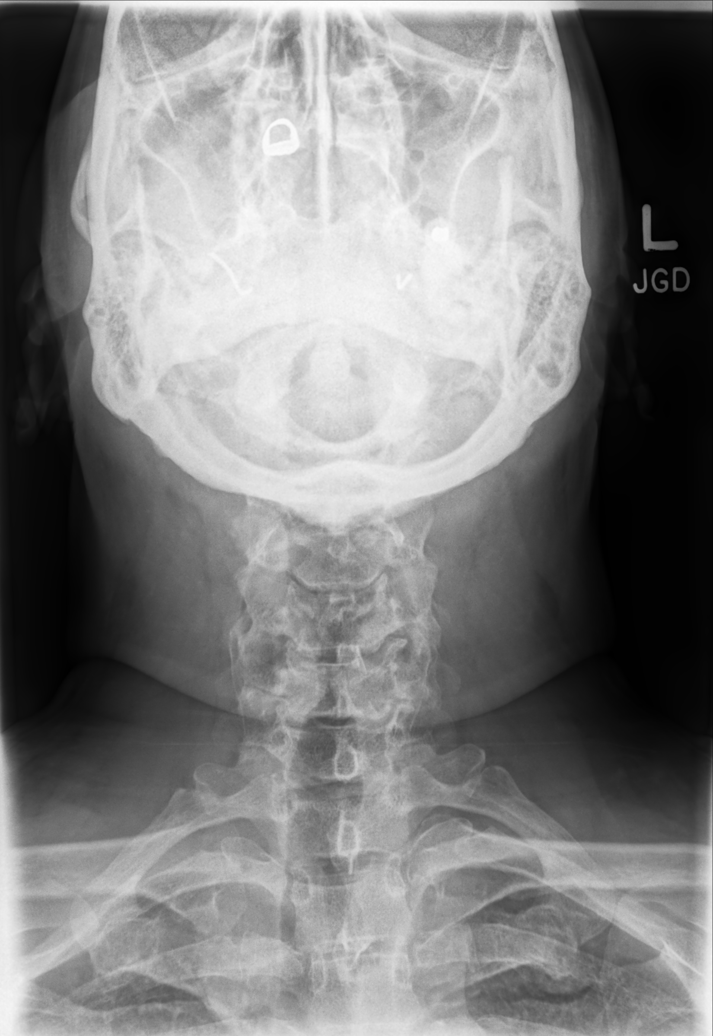

[cervical spine open mouth ap]
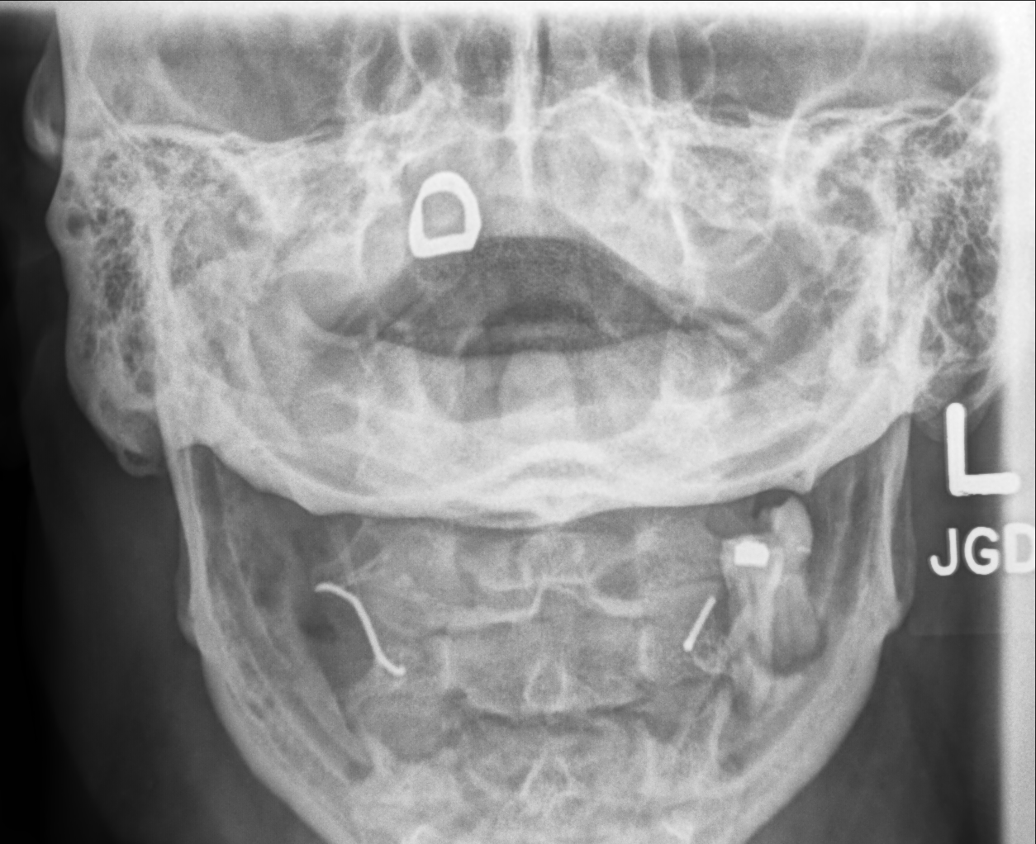

[cervical spine oblique (1 of 2)]
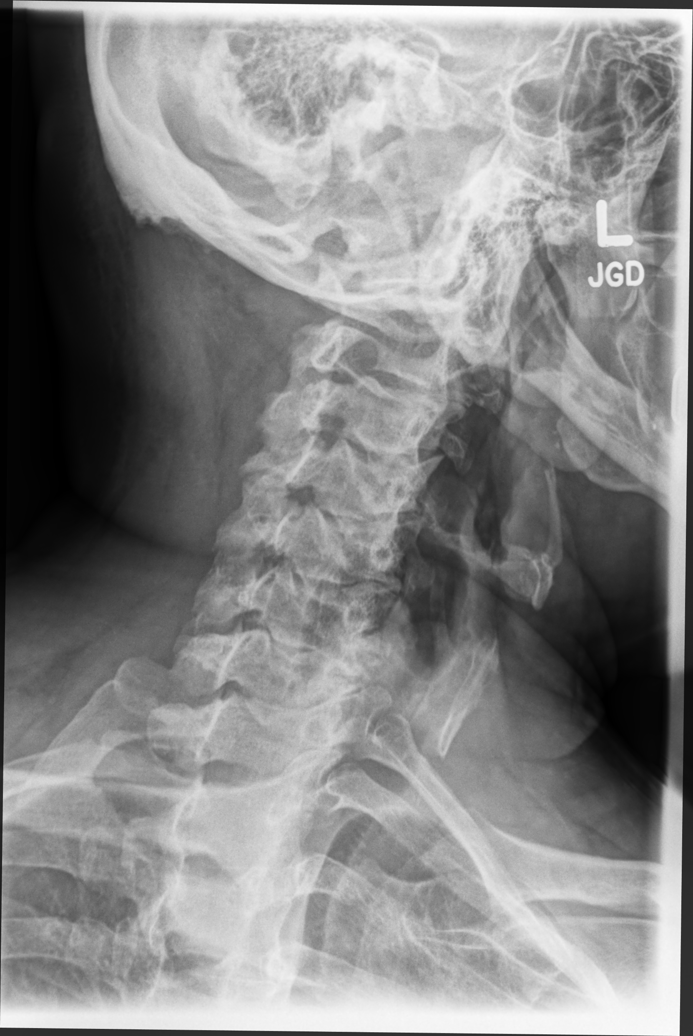

[cervical spine oblique (2 of 2)]
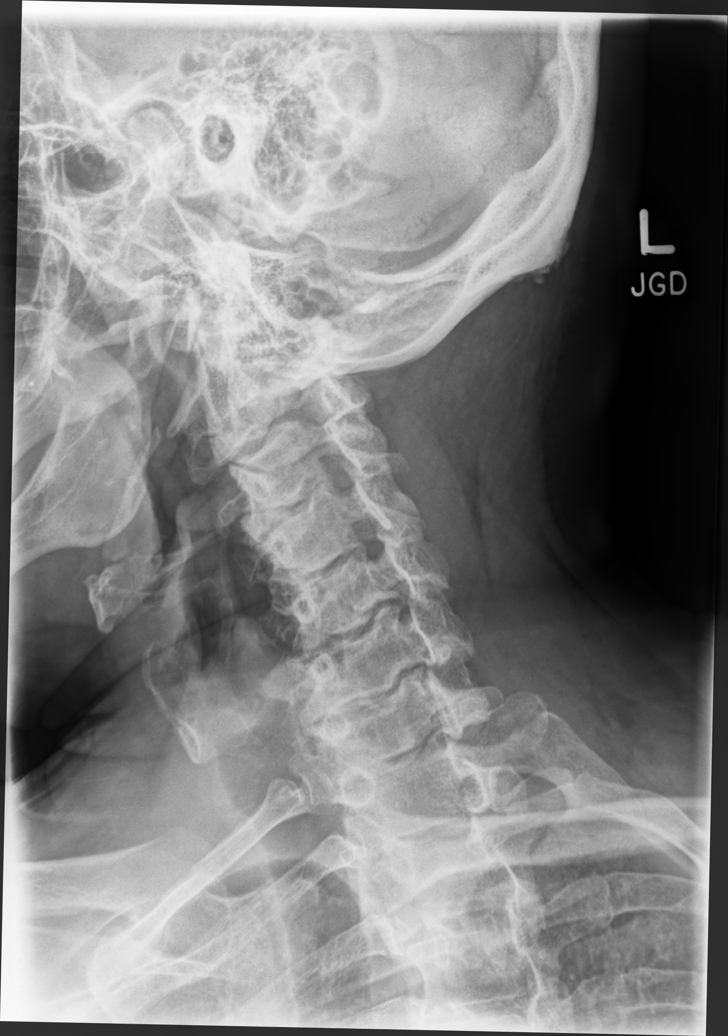

[5 of 5 positions shown; findings below may reference images not displayed]

FINDINGS: Straightening of the normal cervical lordosis. Disc space narrowing
and anterior and posterior spur formation from the C3-4 through the
C7-T1 levels. The inferior neural foramina are not adequately
visualized on either oblique view due to inadequate patient
rotation. No significant upper cervical spine foraminal stenosis. No
fractures or subluxations.
IMPRESSION: Extensive cervical spine degenerative changes from the C3-4 through
the C7-T1 level. The neural foramina in the inferior half of the
cervical spine can not be adequately visualized due to insufficient
patient rotation on the oblique images.

## 2024-01-21 ENCOUNTER — Other Ambulatory Visit: Payer: Self-pay

## 2024-01-21 ENCOUNTER — Encounter (HOSPITAL_BASED_OUTPATIENT_CLINIC_OR_DEPARTMENT_OTHER): Payer: Self-pay | Admitting: Emergency Medicine

## 2024-01-21 ENCOUNTER — Emergency Department (HOSPITAL_BASED_OUTPATIENT_CLINIC_OR_DEPARTMENT_OTHER)
Admission: EM | Admit: 2024-01-21 | Discharge: 2024-01-21 | Disposition: A | Discharge status: Home / Self Care | Attending: Emergency Medicine | Admitting: Emergency Medicine

## 2024-01-21 DIAGNOSIS — R070 Pain in throat: Secondary | ICD-10-CM | POA: Diagnosis present

## 2024-01-21 HISTORY — DX: Pure hypercholesterolemia, unspecified: E78.00

## 2024-01-21 HISTORY — DX: Sleep apnea, unspecified: G47.30

## 2024-01-21 LAB — GROUP A STREP BY PCR: Group A Strep by PCR: NOT DETECTED

## 2024-01-21 MED ORDER — LIDOCAINE VISCOUS HCL 2 % MT SOLN: 15.0000 mL | OROMUCOSAL | 0 refills | Status: AC | PRN

## 2024-01-21 NOTE — ED Provider Notes (Signed)
 [place] [facility] Provider Note   CSN: [id number] Arrival date & time: 01/21/24  9177     Patient presents with: Sore Throat   Trevor Patrick is a 63 y.o. male with no significant past medical history presents with concern for a sore throat that has been ongoing for the past month.  Reports pain is diffuse throughout his throat, but seems to be worse on the left side.  He does not report any difficulties with swallowing foods.  Denies fever or chills.  Denies cough, rhinorrhea, or congestion.  He does not feel like this is similar to when he previously had acid reflux.    Sore Throat       Prior to Admission medications   Medication Sig Start Date End Date Taking? Authorizing Provider  lidocaine (XYLOCAINE) 2 % solution Use as directed 15 mLs in the mouth or throat every 4 (four) hours as needed (Throat pain). 01/21/24  Yes Veta Palma, PA-C  buPROPion (WELLBUTRIN XL) 300 MG 24 hr tablet TAKE ONE TABLET BY MOUTH EVERY MORNING FOR MOOD          TAKE AT [facility] 07/02/21   [provider]  meloxicam  (MOBIC ) 15 MG tablet Take 1 tablet daily with food as needed. 03/25/22   Arvell Lye R, DO  predniSONE  (DELTASONE ) 20 MG tablet Day 1-5: Take 2 tablets daily. Day 6-10: Take 1 tablet daily. Take tablets with breakfast. 08/02/21   Christopher Savannah, PA-C  rosuvastatin (CRESTOR) 40 MG tablet Take by mouth. 05/30/21   [provider]  tiZANidine  (ZANAFLEX ) 4 MG tablet Take 1 tablet (4 mg total) by mouth at bedtime. 08/02/21   Christopher Savannah, PA-C  topiramate (TOPAMAX) 100 MG tablet TAKE ONE TABLET BY MOUTH AT BEDTIME FOR 7 DAYS, THEN TAKE ONE TABLET TWICE A DAY FOR MIGRAINE 05/27/21   [provider]    Allergies: Nortriptyline and Sumatriptan    Review of Systems  HENT:  Positive for sore throat.     Updated Vital Signs BP (!) 146/93   Pulse 73   Temp 98.6 F (37 C) (Oral)   Resp 18   SpO2 95%   Physical Exam Vitals  and nursing note reviewed.  Constitutional:      General: He is not in acute distress.    Appearance: He is well-developed.  HENT:     Head: Normocephalic and atraumatic.     Right Ear: Tympanic membrane and ear canal normal.     Left Ear: Tympanic membrane and ear canal normal.     Mouth/Throat:     Mouth: Mucous membranes are moist.     Pharynx: No oropharyngeal exudate or posterior oropharyngeal erythema.  Eyes:     Conjunctiva/sclera: Conjunctivae normal.  Cardiovascular:     Rate and Rhythm: Normal rate and regular rhythm.     Heart sounds: No murmur heard. Pulmonary:     Effort: Pulmonary effort is normal. No respiratory distress.     Breath sounds: Normal breath sounds.  Abdominal:     Palpations: Abdomen is soft.     Tenderness: There is no abdominal tenderness.  Musculoskeletal:        General: No swelling.     Cervical back: Neck supple.  Lymphadenopathy:     Cervical: No cervical adenopathy.  Skin:    General: Skin is warm and dry.     Capillary Refill: Capillary refill takes less than 2 seconds.  Neurological:     Mental Status:  He is alert.  Psychiatric:        Mood and Affect: Mood normal.     (all labs ordered are listed, but only abnormal results are displayed) Labs Reviewed  GROUP A STREP BY PCR    EKG: None  Radiology: No results found.   Procedures   Medications Ordered in the ED - No data to display                                  Medical Decision Making Risk Prescription drug management.     Differential diagnosis includes but is not limited to strep pharyngitis, viral pharyngitis, acid reflux, Barrett's esophagus, malignancy, deep space infection  ED Course:  Upon initial evaluation, patient is well-appearing, no acute distress.  Normal vitals aside from elevated blood pressure 146/93 upon arrival.  Reporting ongoing sore throat for the past month.  He is able to swallow and is swallowing secretions without difficulty on exam.   No trismus.  Posterior oropharynx without any erythema or edema.  Tonsils without any erythema, edema, tonsillar exudate.  No signs of peritonsillar abscess.  No edema of the sublingual space, neck soft and supple, no concern for Ludwig angina.  No other symptoms such as cough, congestion, lower concern for a viral URI at this time.  Labs Ordered: I Ordered, and personally interpreted labs.  The pertinent results include:   Strep negative  Medications Given: None  Upon re-evaluation, patient remains well-appearing.  Discussed that his strep test was negative and on exam, it does not appear that he has any infection in his throat or ears.  Given the ongoing nature of the throat pain, feel he needs further evaluation by ENT for possible endoscopy to rule out cause such as acid reflux, Barrett's esophagus, or other.  He does not have any red flag signs at this time such as difficulties with swallowing, weight loss, fevers.  He is stable and appropriate for outpatient follow-up at this time.    Impression: Throat pain  Disposition:  The patient was discharged home with instructions to call and schedule appointment with ENT, their contact information was provided.  May use viscous lidocaine as needed for throat pain. Return precautions given and patient verbalized understanding.   This chart was dictated using voice recognition software, Dragon. Despite the best efforts of this provider to proofread and correct errors, errors may still occur which can change documentation meaning.       Final diagnoses:  Throat pain    ED Discharge Orders          Ordered    lidocaine (XYLOCAINE) 2 % solution  Every 4 hours PRN        01/21/24 0951               Veta Palma, PA-C 01/21/24 9044    Dreama Longs, MD 01/21/24 1115

## 2024-01-21 NOTE — Discharge Instructions (Addendum)
 Your strep test was negative today.  It does not appear that you have an infection of your throat or ears on your exam.  There can be many causes of a ongoing sore throat, but these require further evaluation by an ear nose and throat doctor to get to the root cause.  Please schedule follow-up appointment with the ENT doctor listed below as soon as possible for further evaluation.  You have been prescribed a medication called viscous lidocaine to help with throat pain.  You may use this on an as needed basis as prescribed.  Please return to the ER if you are unable to swallow, you have unexplained fevers, any other new or concerning symptoms

## 2024-01-21 NOTE — ED Triage Notes (Signed)
Sore throat x 1 month.  

## 2024-01-21 NOTE — ED Notes (Signed)

## 2024-01-21 NOTE — ED Notes (Signed)
 Cannot discharge, registration in chart.

## 2024-01-30 ENCOUNTER — Ambulatory Visit (INDEPENDENT_AMBULATORY_CARE_PROVIDER_SITE_OTHER): Admitting: Podiatry

## 2024-01-30 ENCOUNTER — Ambulatory Visit (INDEPENDENT_AMBULATORY_CARE_PROVIDER_SITE_OTHER)

## 2024-01-30 ENCOUNTER — Encounter: Payer: Self-pay | Admitting: Podiatry

## 2024-01-30 VITALS — Ht 69.0 in | Wt 235.0 lb

## 2024-01-30 DIAGNOSIS — M7751 Other enthesopathy of right foot: Secondary | ICD-10-CM | POA: Diagnosis not present

## 2024-01-30 DIAGNOSIS — M19072 Primary osteoarthritis, left ankle and foot: Secondary | ICD-10-CM | POA: Diagnosis not present

## 2024-01-30 DIAGNOSIS — M2142 Flat foot [pes planus] (acquired), left foot: Secondary | ICD-10-CM | POA: Diagnosis not present

## 2024-01-30 DIAGNOSIS — M2141 Flat foot [pes planus] (acquired), right foot: Secondary | ICD-10-CM | POA: Diagnosis not present

## 2024-01-30 DIAGNOSIS — M19071 Primary osteoarthritis, right ankle and foot: Secondary | ICD-10-CM | POA: Diagnosis not present

## 2024-01-30 DIAGNOSIS — M7752 Other enthesopathy of left foot: Secondary | ICD-10-CM | POA: Diagnosis not present

## 2024-01-31 DIAGNOSIS — M19072 Primary osteoarthritis, left ankle and foot: Secondary | ICD-10-CM | POA: Diagnosis not present

## 2024-01-31 DIAGNOSIS — M19071 Primary osteoarthritis, right ankle and foot: Secondary | ICD-10-CM | POA: Diagnosis not present

## 2024-01-31 MED ORDER — MELOXICAM 15 MG PO TABS: 15.0000 mg | ORAL_TABLET | Freq: Every day | ORAL | 1 refills | Status: AC

## 2024-01-31 MED ORDER — BETAMETHASONE SOD PHOS & ACET 6 (3-3) MG/ML IJ SUSP
3.0000 mg | Freq: Once | INTRAMUSCULAR | Status: AC
  Administered 2024-01-31: 3 mg via INTRA_ARTICULAR

## 2024-01-31 NOTE — Progress Notes (Signed)
   No chief complaint on file.  Subjective:  63 y.o. male presenting today for follow-up evaluation of pain and tenderness associated to the right ankle as well as flatfoot deformity.  Patient believes that his flatfoot deformity was exacerbated during his service in the [employer].   Since last visit he continues to have pain and tenderness associated to the feet.  He does wear his orthotics which were dispensed 10/12/2022 which helped minimally   Past Medical History:  Diagnosis Date   High cholesterol    Migraine    Sleep apnea    Past Surgical History:  Procedure Laterality Date   HERNIA REPAIR     Allergies  Allergen Reactions   Nortriptyline Other (See Comments)    Other Reaction(s): Weight Gain   Sumatriptan Other (See Comments)       Objective/Physical Exam General: The patient is alert and oriented x3 in no acute distress.  Dermatology: Skin is warm, dry and supple bilateral lower extremities. Negative for open lesions or macerations.  Vascular: Palpable pedal pulses bilaterally. No edema or erythema noted. Capillary refill within normal limits.  Neurological: Light touch and protective threshold grossly intact bilaterally.   Musculoskeletal Exam: Range of motion within normal limits to all pedal and ankle joints bilateral. Muscle strength 5/5 in all groups bilateral. Upon weightbearing there is a medial longitudinal arch collapse bilaterally. Remove foot valgus noted to the bilateral lower extremities with excessive pronation upon mid stance.  There is also tenderness with palpation over the lateral aspect of the right ankle  Radiographic Exam LT ankle and RT foot 01/31/2024:  Unchanged.  Normal osseous mineralization. Joint spaces preserved. No fracture/dislocation/boney destruction.  Pes planus noted on radiographic exam lateral views. Decreased calcaneal inclination and metatarsal declination angle is noted. Anterior break in the cyma line noted on lateral views.  Medial talar head to deviation noted on AP radiograph.  Degenerative changes noted with dorsal spurring noted to the TN joint best visualized on lateral view.  Assessment: 1. pes planus bilateral 2.  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 X-Rays reviewed.  -Injection of 0.5 cc Celestone  Soluspan injected into the bilateral ankles -Ankle brace was dispensed last visit.  Continue PRN - Continue custom molded orthotics -Prescription for meloxicam  15 mg daily PRN -Today we also discussed surgical intervention however the patient is not interested in surgery at the moment.  He would like to continue to pursue conservative treatment management -Return to clinic PRN   Thresa EMERSON Sar, DPM [facility]  Dr. Thresa EMERSON Sar, DPM    [address]                                        [place], [place] [address]                Office [phone]  Fax [phone]

## 2024-03-28 ENCOUNTER — Other Ambulatory Visit: Payer: Self-pay

## 2024-03-28 ENCOUNTER — Emergency Department (HOSPITAL_BASED_OUTPATIENT_CLINIC_OR_DEPARTMENT_OTHER)
Admission: EM | Admit: 2024-03-28 | Discharge: 2024-03-28 | Disposition: A | Discharge status: Home / Self Care | Attending: Emergency Medicine | Admitting: Emergency Medicine

## 2024-03-28 ENCOUNTER — Emergency Department (HOSPITAL_BASED_OUTPATIENT_CLINIC_OR_DEPARTMENT_OTHER)

## 2024-03-28 ENCOUNTER — Encounter (HOSPITAL_BASED_OUTPATIENT_CLINIC_OR_DEPARTMENT_OTHER): Payer: Self-pay | Admitting: Emergency Medicine

## 2024-03-28 DIAGNOSIS — M79601 Pain in right arm: Secondary | ICD-10-CM | POA: Diagnosis not present

## 2024-03-28 DIAGNOSIS — M79604 Pain in right leg: Secondary | ICD-10-CM | POA: Diagnosis not present

## 2024-03-28 DIAGNOSIS — R079 Chest pain, unspecified: Secondary | ICD-10-CM | POA: Diagnosis present

## 2024-03-28 LAB — CBC
HCT: 48.4 % (ref 39.0–52.0)
Hemoglobin: 16.2 g/dL (ref 13.0–17.0)
MCH: 28 pg (ref 26.0–34.0)
MCHC: 33.5 g/dL (ref 30.0–36.0)
MCV: 83.6 fL (ref 80.0–100.0)
Platelets: 108 K/uL — ABNORMAL LOW (ref 150–400)
RBC: 5.79 MIL/uL (ref 4.22–5.81)
RDW: 14.1 % (ref 11.5–15.5)
WBC: 3.8 K/uL — ABNORMAL LOW (ref 4.0–10.5)
nRBC: 0 % (ref 0.0–0.2)

## 2024-03-28 LAB — BASIC METABOLIC PANEL WITH GFR
Anion gap: 13 (ref 5–15)
BUN: 11 mg/dL (ref 8–23)
CO2: 24 mmol/L (ref 22–32)
Calcium: 9.4 mg/dL (ref 8.9–10.3)
Chloride: 103 mmol/L (ref 98–111)
Creatinine, Ser: 1.07 mg/dL (ref 0.61–1.24)
GFR, Estimated: >60 mL/min
Glucose, Bld: 102 mg/dL — ABNORMAL HIGH (ref 70–99)
Potassium: 4.1 mmol/L (ref 3.5–5.1)
Sodium: 140 mmol/L (ref 135–145)

## 2024-03-28 LAB — TROPONIN T, HIGH SENSITIVITY: Troponin T High Sensitivity: 9 ng/L (ref 0–19)

## 2024-03-28 NOTE — ED Provider Notes (Signed)
 " [place] [facility] Provider Note   CSN: [id number] Arrival date & time: 03/28/24  1352     Patient presents with: Arm Pain and Leg Pain   Trevor Patrick is a 64 y.o. male.   Patient with history of prediabetes on metformin, high cholesterol --presents to the emergency department for evaluation of chest and leg pain.  Symptoms occurred during a stressful situation this morning between 9 and 10 AM.  Patient reports being on the phone with a family member with dementia, who was yelling.  He states that he was frantically trying to contact siblings at the time.  Patient developed pain in the right chest and right leg.  No associated weakness.  Symptoms lasted several minutes before resolving.  No significant chest pain or shortness of breath.  He reports mild headache.  No diaphoresis or vomiting.  Patient has not had symptoms like this in the past.  No exertional symptoms. Patient denies signs of stroke including: facial droop, slurred speech, aphasia, weakness/numbness in extremities, imbalance/trouble walking.  Patient denies history of hypertension, tobacco use, family history of heart disease.        Prior to Admission medications  Medication Sig Start Date End Date Taking? Authorizing Provider  buPROPion (WELLBUTRIN XL) 300 MG 24 hr tablet TAKE ONE TABLET BY MOUTH EVERY MORNING FOR MOOD          TAKE AT [facility] 07/02/21   [provider]  lidocaine  (XYLOCAINE ) 2 % solution Use as directed 15 mLs in the mouth or throat every 4 (four) hours as needed (Throat pain). 01/21/24   Franaszek, Amanda, PA-C  meloxicam  (MOBIC ) 15 MG tablet Take 1 tablet daily with food as needed. 03/25/22   Arvell Evalene SAUNDERS, DO  meloxicam  (MOBIC ) 15 MG tablet Take 1 tablet (15 mg total) by mouth daily. 01/31/24 05/30/24  Evans, Brent M, DPM  predniSONE  (DELTASONE ) 20 MG tablet Day 1-5: Take 2 tablets daily. Day 6-10: Take 1 tablet daily. Take tablets with breakfast.  08/02/21   Christopher Savannah, PA-C  rosuvastatin (CRESTOR) 40 MG tablet Take by mouth. 05/30/21   [provider]  tiZANidine  (ZANAFLEX ) 4 MG tablet Take 1 tablet (4 mg total) by mouth at bedtime. 08/02/21   Christopher Savannah, PA-C  topiramate (TOPAMAX) 100 MG tablet TAKE ONE TABLET BY MOUTH AT BEDTIME FOR 7 DAYS, THEN TAKE ONE TABLET TWICE A DAY FOR MIGRAINE 05/27/21   [provider]    Allergies: Nortriptyline and Sumatriptan    Review of Systems  Updated Vital Signs BP 136/89 (BP Location: Right Arm)   Pulse 70   Temp 97.7 F (36.5 C) (Oral)   Resp 20   Ht 5' 9 (1.753 m)   Wt 99.8 kg   SpO2 97%   BMI 32.49 kg/m   Physical Exam Vitals and nursing note reviewed.  Constitutional:      Appearance: He is well-developed. He is not diaphoretic.  HENT:     Head: Normocephalic and atraumatic.     Mouth/Throat:     Mouth: Mucous membranes are not dry.  Eyes:     Conjunctiva/sclera: Conjunctivae normal.  Neck:     Vascular: Normal carotid pulses. No carotid bruit or JVD.     Trachea: Trachea normal. No tracheal deviation.  Cardiovascular:     Rate and Rhythm: Normal rate and regular rhythm.     Pulses: No decreased pulses.          Radial pulses are 2+  on the right side and 2+ on the left side.       Dorsalis pedis pulses are 2+ on the right side and 2+ on the left side.     Heart sounds: Normal heart sounds, S1 normal and S2 normal. Heart sounds not distant. No murmur heard.    Comments: Extremity exam is unremarkable. No signs of ischemia.  Pulmonary:     Effort: Pulmonary effort is normal. No respiratory distress.     Breath sounds: Normal breath sounds. No wheezing.  Chest:     Chest wall: No tenderness.  Abdominal:     General: Bowel sounds are normal.     Palpations: Abdomen is soft.     Tenderness: There is no abdominal tenderness. There is no guarding or rebound.  Musculoskeletal:     Cervical back: Normal range of motion and neck supple. No muscular  tenderness.     Right lower leg: No edema.     Left lower leg: No edema.  Skin:    General: Skin is warm and dry.     Coloration: Skin is not pale.  Neurological:     Mental Status: He is alert. Mental status is at baseline.     Motor: No weakness.     Gait: Gait normal.  Psychiatric:        Mood and Affect: Mood normal.     (all labs ordered are listed, but only abnormal results are displayed) Labs Reviewed - No data to display  ED ECG REPORT   Date: 03/28/2024  Rate: 63  Rhythm: normal sinus rhythm  QRS Axis: left  Intervals: normal  ST/T Wave abnormalities: normal  Conduction Disutrbances:none  Narrative Interpretation:   Old EKG Reviewed: unchanged except axis more leftward  I have personally reviewed the EKG tracing and agree with the computerized printout as noted.   Radiology: DG Chest 2 View Result Date: 03/28/2024 CLINICAL DATA:  Right arm pain EXAM: CHEST - 2 VIEW COMPARISON:  March 02, 2020 FINDINGS: The heart size and mediastinal contours are within normal limits. Both lungs are clear. The visualized skeletal structures are unremarkable. IMPRESSION: No active cardiopulmonary disease. Electronically Signed   By: Lynwood Landy Raddle M.D.   On: 03/28/2024 14:50     Procedures   Medications Ordered in the ED - No data to display  ED Course  Patient seen and examined. History obtained directly from patient.  Patient reports mainly being worried about a heart attack or stroke.  Otherwise, no persistent symptoms.  He does have some risk factors for cardiac disease.  Symptoms not typical for stroke.  Labs/EKG: Ordered CBC, BMP, troponin.  EKG.  Imaging: Ordered chest x-ray.  Medications/Fluids: None ordered  Most recent vital signs reviewed and are as follows: BP 136/89 (BP Location: Right Arm)   Pulse 70   Temp 97.7 F (36.5 C) (Oral)   Resp 20   Ht 5' 9 (1.753 m)   Wt 99.8 kg   SpO2 97%   BMI 32.49 kg/m   Initial impression: Transient right arm  and leg pain.  At time of exam, resolved.  Symptoms short-lived.  Patient presents today due to concern for ACS and stroke.  Symptoms will be atypical for ACS or PE.  Symptoms would not be typical for stroke.  3:53 PM Reassessment performed. Patient appears stable, comfortable.   Labs personally reviewed and interpreted including: CBC white blood cell count and platelet count are slightly low, consistent with readings over the past several  years; BMP glucose 102 otherwise unremarkable; troponin normal.    Imaging personally visualized and interpreted including: Chest x-ray agree negative.  Reviewed pertinent lab work and imaging with patient at bedside. Questions answered.   Most current vital signs reviewed and are as follows: BP 120/79 (BP Location: Right Arm)   Pulse 61   Temp 97.7 F (36.5 C) (Oral)   Resp 12   Ht 5' 9 (1.753 m)   Wt 99.8 kg   SpO2 97%   BMI 32.49 kg/m   Plan: Discharge to home.   Prescriptions written for: None  Return and follow-up instructions: I encouraged patient to return to ED with severe chest pain, especially if the pain is crushing or pressure-like and spreads to the arms, back, neck, or jaw, or if they have associated sweating, vomiting, or shortness of breath with the pain, or significant pain with activity. We discussed that the evaluation here today indicates a low-risk of serious cause of chest pain, including heart trouble or a blood clot, but no evaluation is perfect and chest pain can evolve with time. The patient verbalized understanding and agreed.  I encouraged patient to follow-up with their provider in 1 week for recheck.                                    Medical Decision Making Amount and/or Complexity of Data Reviewed Labs: ordered. Radiology: ordered.   For this patient's complaint of chest pain, the following emergent conditions were considered on the differential diagnosis: acute coronary syndrome, pulmonary embolism,  pneumothorax, myocarditis, pericardial tamponade, aortic dissection, thoracic aortic aneurysm complication, esophageal perforation.   Other causes were also considered including: gastroesophageal reflux disease, musculoskeletal pain including costochondritis, pneumonia/pleurisy, herpes zoster, pericarditis.  In regards to possibility of ACS, patient has atypical features of pain, non-ischemic and unchanged EKG and negative troponin(s). Heart score was calculated to be 2.   In regards to possibility of PE, no clinical concern.  No persistent chest pain, associated shortness of breath, tachycardia, hypoxia.  The patient's vital signs, pertinent lab work and imaging were reviewed and interpreted as discussed in the ED course. Hospitalization was considered for further testing, treatments, or serial exams/observation. However as patient is well-appearing, has a stable exam, and reassuring studies today, I do not feel that they warrant admission at this time. This plan was discussed with the patient who verbalizes agreement and comfort with this plan and seems reliable and able to return to the Emergency Department with worsening or changing symptoms.        Final diagnoses:  Right-sided chest pain  Right leg pain  Right arm pain    ED Discharge Orders     None          Desiderio Chew, PA-C 03/28/24 1555    Long, Chew MATSU, MD 03/31/24 [phone]  "

## 2024-03-28 NOTE — ED Notes (Signed)
 States was stressing his mom and his whole right side started to feel bad

## 2024-03-28 NOTE — ED Triage Notes (Signed)
 Pt c/o R arm and leg pain earlier today, denies at time of triage. Reports initial difficulty walking due to pain at time of onset.    Denies nausea, shob, diaphoresis.

## 2024-03-28 NOTE — Discharge Instructions (Addendum)
 Please read and follow all provided instructions.  Your diagnoses today include:  1. Right-sided chest pain   2. Right leg pain     Tests performed today include: An EKG of your heart: Appears stable from several years ago A chest x-ray: No apparent issues with the lungs visualized Cardiac enzymes - a blood test for heart muscle damage was normal Blood counts and electrolytes: Your platelet count and your white blood cell count was slightly low, this has been the case for several years, this is not related to your pain today and you should just have this monitored by your doctor Vital signs. See below for your results today.   Medications prescribed:  None  Take any prescribed medications only as directed.  Follow-up instructions: Please follow-up with your primary care provider in the next 1 week for further evaluation of your symptoms.  Return instructions:  SEEK IMMEDIATE MEDICAL ATTENTION IF: You have severe chest pain, especially if the pain is crushing or pressure-like and spreads to the arms, back, neck, or jaw, or if you have sweating, nausea or vomiting, or trouble with breathing. THIS IS AN EMERGENCY. Do not wait to see if the pain will go away. Get medical help at once. Call 911. DO NOT drive yourself to the hospital.  Your chest pain gets worse and does not go away after a few minutes of rest.  You have an attack of chest pain lasting longer than what you usually experience.  You have significant dizziness, if you pass out, or have trouble walking.  You have chest pain not typical of your usual pain for which you originally saw your caregiver.  You have any other emergent concerns regarding your health.  Additional Information: Chest pain comes from many different causes. Your caregiver has diagnosed you as having chest pain that is not specific for one problem, but does not require admission.  You are at low risk for an acute heart condition or other serious illness.    Your vital signs today were: BP 136/89 (BP Location: Right Arm)   Pulse 70   Temp 97.7 F (36.5 C) (Oral)   Resp 20   Ht 5' 9 (1.753 m)   Wt 99.8 kg   SpO2 97%   BMI 32.49 kg/m  If your blood pressure (BP) was elevated above 135/85 this visit, please have this repeated by your doctor within one month. --------------

## 2024-03-28 NOTE — ED Notes (Signed)
 Report received from [place], [place]. Assuming patient care at this time.
